# Patient Record
Sex: Female | Born: 1975 | Race: Black or African American | Hispanic: No | Marital: Single | Smoking: Never smoker
Health system: Southern US, Community
[De-identification: ages and names within clinical notes are randomized; demographics above are authoritative.]

## PROBLEM LIST (undated history)

## (undated) DIAGNOSIS — G43909 Migraine, unspecified, not intractable, without status migrainosus: Secondary | ICD-10-CM

---

## 2016-07-06 ENCOUNTER — Encounter (HOSPITAL_BASED_OUTPATIENT_CLINIC_OR_DEPARTMENT_OTHER): Payer: Self-pay | Admitting: *Deleted

## 2016-07-06 ENCOUNTER — Emergency Department (HOSPITAL_BASED_OUTPATIENT_CLINIC_OR_DEPARTMENT_OTHER)
Admission: EM | Admit: 2016-07-06 | Discharge: 2016-07-06 | Disposition: A | Discharge status: Home / Self Care | Payer: No Typology Code available for payment source | Attending: Physician Assistant | Admitting: Physician Assistant

## 2016-07-06 DIAGNOSIS — Y92410 Unspecified street and highway as the place of occurrence of the external cause: Secondary | ICD-10-CM | POA: Diagnosis not present

## 2016-07-06 DIAGNOSIS — M549 Dorsalgia, unspecified: Secondary | ICD-10-CM | POA: Insufficient documentation

## 2016-07-06 DIAGNOSIS — Y999 Unspecified external cause status: Secondary | ICD-10-CM | POA: Diagnosis not present

## 2016-07-06 DIAGNOSIS — Y939 Activity, unspecified: Secondary | ICD-10-CM | POA: Insufficient documentation

## 2016-07-06 MED ORDER — CYCLOBENZAPRINE HCL 10 MG PO TABS: 10.0000 mg | ORAL_TABLET | Freq: Two times a day (BID) | ORAL | 0 refills | Status: AC | PRN

## 2016-07-06 MED ORDER — IBUPROFEN 800 MG PO TABS: 800.0000 mg | ORAL_TABLET | Freq: Three times a day (TID) | ORAL | 0 refills | Status: DC
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
## 2017-10-26 ENCOUNTER — Other Ambulatory Visit: Payer: Self-pay

## 2017-10-26 ENCOUNTER — Encounter (HOSPITAL_COMMUNITY): Payer: Self-pay

## 2017-10-26 DIAGNOSIS — Z5321 Procedure and treatment not carried out due to patient leaving prior to being seen by health care provider: Secondary | ICD-10-CM | POA: Diagnosis not present

## 2017-10-26 DIAGNOSIS — R51 Headache: Secondary | ICD-10-CM | POA: Diagnosis not present

## 2017-10-26 NOTE — ED Triage Notes (Signed)
Patient states she has Margine with hx that started yesterday; patient states pain at 10/10 on arrival. Pt denies vision changes or disturbances; Patient states migraine feels worse that normal; pt a&ox 4 on arrival.-Monique,RN

## 2017-10-27 ENCOUNTER — Emergency Department (HOSPITAL_COMMUNITY)
Admission: EM | Admit: 2017-10-27 | Discharge: 2017-10-27 | Disposition: A | Discharge status: Home / Self Care | Payer: 59 | Attending: Emergency Medicine | Admitting: Emergency Medicine

## 2017-10-27 HISTORY — DX: Migraine, unspecified, not intractable, without status migrainosus: G43.909

## 2017-10-27 NOTE — ED Notes (Signed)
No answer for treatment room. LWBS after triage.

## 2017-10-27 NOTE — ED Notes (Signed)
Pt called twice no answer

## 2017-11-03 ENCOUNTER — Ambulatory Visit: Payer: Self-pay | Admitting: Neurology

## 2017-11-03 ENCOUNTER — Telehealth: Payer: Self-pay | Admitting: *Deleted

## 2017-11-03 NOTE — Telephone Encounter (Signed)
Patient no show new pt appointment on 11/03/2017 @ 10:00.

## 2017-11-07 ENCOUNTER — Encounter: Payer: Self-pay | Admitting: Neurology

## 2017-11-10 ENCOUNTER — Ambulatory Visit: Payer: Self-pay | Admitting: Neurology

## 2018-10-18 ENCOUNTER — Emergency Department (HOSPITAL_COMMUNITY): Payer: Medicaid Other

## 2018-10-18 ENCOUNTER — Encounter: Payer: Self-pay | Admitting: Emergency Medicine

## 2018-10-18 ENCOUNTER — Emergency Department (HOSPITAL_COMMUNITY)
Admission: EM | Admit: 2018-10-18 | Discharge: 2018-10-18 | Disposition: A | Discharge status: Home / Self Care | Payer: Medicaid Other | Attending: Emergency Medicine | Admitting: Emergency Medicine

## 2018-10-18 DIAGNOSIS — Z79899 Other long term (current) drug therapy: Secondary | ICD-10-CM | POA: Insufficient documentation

## 2018-10-18 DIAGNOSIS — R1084 Generalized abdominal pain: Secondary | ICD-10-CM | POA: Diagnosis present

## 2018-10-18 LAB — COMPREHENSIVE METABOLIC PANEL
ALK PHOS: 41 U/L (ref 38–126)
ALT: 18 U/L (ref 0–44)
ANION GAP: 7 (ref 5–15)
AST: 19 U/L (ref 15–41)
Albumin: 4.2 g/dL (ref 3.5–5.0)
BILIRUBIN TOTAL: 0.6 mg/dL (ref 0.3–1.2)
BUN: 16 mg/dL (ref 6–20)
CALCIUM: 9.4 mg/dL (ref 8.9–10.3)
CHLORIDE: 106 mmol/L (ref 98–111)
CO2: 25 mmol/L (ref 22–32)
Creatinine, Ser: 0.79 mg/dL (ref 0.44–1.00)
Glucose, Bld: 102 mg/dL — ABNORMAL HIGH (ref 70–99)
Potassium: 3.4 mmol/L — ABNORMAL LOW (ref 3.5–5.1)
Sodium: 138 mmol/L (ref 135–145)
Total Protein: 7.8 g/dL (ref 6.5–8.1)

## 2018-10-18 LAB — CBC WITH DIFFERENTIAL/PLATELET
Abs Immature Granulocytes: 0.04 10*3/uL (ref 0.00–0.07)
BASOS ABS: 0 10*3/uL (ref 0.0–0.1)
BASOS PCT: 0 %
Eosinophils Absolute: 0 10*3/uL (ref 0.0–0.5)
Eosinophils Relative: 0 %
HCT: 32.3 % — ABNORMAL LOW (ref 36.0–46.0)
HEMOGLOBIN: 8.8 g/dL — AB (ref 12.0–15.0)
Immature Granulocytes: 0 %
LYMPHS PCT: 26 %
Lymphs Abs: 2.3 10*3/uL (ref 0.7–4.0)
MCH: 18.4 pg — ABNORMAL LOW (ref 26.0–34.0)
MCHC: 27.2 g/dL — AB (ref 30.0–36.0)
MCV: 67.6 fL — ABNORMAL LOW (ref 80.0–100.0)
Monocytes Absolute: 1.2 10*3/uL — ABNORMAL HIGH (ref 0.1–1.0)
Monocytes Relative: 13 %
NEUTROS ABS: 5.4 10*3/uL (ref 1.7–7.7)
NRBC: 0 % (ref 0.0–0.2)
Neutrophils Relative %: 61 %
PLATELETS: 343 10*3/uL (ref 150–400)
RBC: 4.78 MIL/uL (ref 3.87–5.11)
RDW: 18.4 % — ABNORMAL HIGH (ref 11.5–15.5)
WBC: 9 10*3/uL (ref 4.0–10.5)

## 2018-10-18 LAB — WET PREP, GENITAL
Clue Cells Wet Prep HPF POC: NONE SEEN
Sperm: NONE SEEN
Trich, Wet Prep: NONE SEEN
YEAST WET PREP: NONE SEEN

## 2018-10-18 LAB — LIPASE, BLOOD: Lipase: 28 U/L (ref 11–51)

## 2018-10-18 LAB — POC URINE PREG, ED: PREG TEST UR: NEGATIVE

## 2018-10-18 MED ORDER — ONDANSETRON HCL 4 MG/2ML IJ SOLN
4.0000 mg | Freq: Once | INTRAMUSCULAR | Status: AC
  Administered 2018-10-18: 4 mg via INTRAVENOUS
  Filled 2018-10-18: qty 2

## 2018-10-18 MED ORDER — LACTATED RINGERS IV BOLUS
1000.0000 mL | Freq: Once | INTRAVENOUS | Status: AC
  Administered 2018-10-18: 1000 mL via INTRAVENOUS

## 2018-10-18 MED ORDER — IOHEXOL 300 MG/ML  SOLN
100.0000 mL | Freq: Once | INTRAMUSCULAR | Status: AC | PRN
  Administered 2018-10-18: 100 mL via INTRAVENOUS

## 2018-10-18 MED ORDER — ONDANSETRON 4 MG PO TBDP: ORAL_TABLET | ORAL | 0 refills | Status: AC

## 2018-10-18 MED ORDER — MORPHINE SULFATE (PF) 4 MG/ML IV SOLN
4.0000 mg | Freq: Once | INTRAVENOUS | Status: AC
  Administered 2018-10-18: 4 mg via INTRAVENOUS
  Filled 2018-10-18: qty 1

## 2018-10-18 NOTE — ED Notes (Signed)
Patient transported to Ultrasound 

## 2018-10-18 NOTE — Consult Note (Signed)
CC: Consult by Etta Quill NP for RLQ pain  HPI: Terri Shepard is an 42 y.o. female with no known past hx presented to ED today with 3d hx of nausea/vomiting/diarrhea and bilateral lower quadrant abdominal discomfort that she describes as crampy, non-radiating and intermittent. It is made better by moving around and changing positions. She denies any fever or chills. She denies ever having had this before. She denies sick contacts.  Past Medical History:  Diagnosis Date  . Migraine     History reviewed. No pertinent surgical history.  History reviewed. No pertinent family history.  Social:  reports that she has never smoked. She has never used smokeless tobacco. She reports that she does not drink alcohol or use drugs.  Allergies: No Known Allergies  Medications: I have reviewed the patient's current medications.  Results for orders placed or performed during the hospital encounter of 10/18/18 (from the past 48 hour(s))  Comprehensive metabolic panel     Status: Abnormal   Collection Time: 10/18/18  2:40 PM  Result Value Ref Range   Sodium 138 135 - 145 mmol/L   Potassium 3.4 (L) 3.5 - 5.1 mmol/L   Chloride 106 98 - 111 mmol/L   CO2 25 22 - 32 mmol/L   Glucose, Bld 102 (H) 70 - 99 mg/dL   BUN 16 6 - 20 mg/dL   Creatinine, Ser 0.79 0.44 - 1.00 mg/dL   Calcium 9.4 8.9 - 10.3 mg/dL   Total Protein 7.8 6.5 - 8.1 g/dL   Albumin 4.2 3.5 - 5.0 g/dL   AST 19 15 - 41 U/L   ALT 18 0 - 44 U/L   Alkaline Phosphatase 41 38 - 126 U/L   Total Bilirubin 0.6 0.3 - 1.2 mg/dL   GFR calc non Af Amer >60 >60 mL/min   GFR calc Af Amer >60 >60 mL/min    Comment: (NOTE) The eGFR has been calculated using the CKD EPI equation. This calculation has not been validated in all clinical situations. eGFR's persistently <60 mL/min signify possible Chronic Kidney Disease.    Anion gap 7 5 - 15    Comment: Performed at [facility] [address] [place], [place] [address]  Lipase, blood      Status: None   Collection Time: 10/18/18  2:40 PM  Result Value Ref Range   Lipase 28 11 - 51 U/L    Comment: Performed at [facility], [place] [address], [place], [place] [address]  CBC with Differential     Status: Abnormal   Collection Time: 10/18/18  2:40 PM  Result Value Ref Range   WBC 9.0 4.0 - 10.5 K/uL   RBC 4.78 3.87 - 5.11 MIL/uL   Hemoglobin 8.8 (L) 12.0 - 15.0 g/dL    Comment: Reticulocyte Hemoglobin testing may be clinically indicated, consider ordering this additional test [id number]    HCT 32.3 (L) 36.0 - 46.0 %   MCV 67.6 (L) 80.0 - 100.0 fL   MCH 18.4 (L) 26.0 - 34.0 pg   MCHC 27.2 (L) 30.0 - 36.0 g/dL   RDW 18.4 (H) 11.5 - 15.5 %   Platelets 343 150 - 400 K/uL   nRBC 0.0 0.0 - 0.2 %   Neutrophils Relative % 61 %   Neutro Abs 5.4 1.7 - 7.7 K/uL   Lymphocytes Relative 26 %   Lymphs Abs 2.3 0.7 - 4.0 K/uL   Monocytes Relative 13 %   Monocytes Absolute 1.2 (H) 0.1 - 1.0  K/uL   Eosinophils Relative 0 %   Eosinophils Absolute 0.0 0.0 - 0.5 K/uL   Basophils Relative 0 %   Basophils Absolute 0.0 0.0 - 0.1 K/uL   Immature Granulocytes 0 %   Abs Immature Granulocytes 0.04 0.00 - 0.07 K/uL    Comment: Performed at [facility] [address] [place], [place] [address]  POC urine preg, ED     Status: None   Collection Time: 10/18/18  2:55 PM  Result Value Ref Range   Preg Test, Ur NEGATIVE NEGATIVE    Comment:        THE SENSITIVITY OF THIS METHODOLOGY IS >24 mIU/mL   Wet prep, genital     Status: Abnormal   Collection Time: 10/18/18  6:06 PM  Result Value Ref Range   Yeast Wet Prep HPF POC NONE SEEN NONE SEEN   Trich, Wet Prep NONE SEEN NONE SEEN   Clue Cells Wet Prep HPF POC NONE SEEN NONE SEEN   WBC, Wet Prep HPF POC FEW (A) NONE SEEN   Sperm NONE SEEN     Comment: Performed at [facility], [place] [address], [place], [place] [address]    US Transvaginal Non-ob  Result Date: 10/18/2018 CLINICAL DATA:  Lower abdominal pain for 3 days.  EXAM: TRANSABDOMINAL AND TRANSVAGINAL ULTRASOUND OF PELVIS DOPPLER ULTRASOUND OF OVARIES TECHNIQUE: Both transabdominal and transvaginal ultrasound examinations of the pelvis were performed. Transabdominal technique was performed for global imaging of the pelvis including uterus, ovaries, adnexal regions, and pelvic cul-de-sac. It was necessary to proceed with endovaginal exam following the transabdominal exam to visualize the endometrium and ovaries. Color and duplex Doppler ultrasound was utilized to evaluate blood flow to the ovaries. COMPARISON:  None. FINDINGS: Uterus Measurements: 10.2 x 5.2 x 7.9 cm = volume: 216.8 mL. Multiple fibroids. A fibroid in the left fundus measures 1.8 x 1.4 x 1.4 cm. A fibroid in the right fundus measures 1.4 x 1.1 x 1.4 cm. A fibroid in the right uterine body measures 2.1 x 1.1 x 2.1 cm. Endometrium Thickness: 2 mm.  No focal abnormality visualized. Right ovary Not visualized. Left ovary Measurements: 3.5 x 2.4 x 2.2 cm = volume: 9.6 mL. Contains a follicle measuring up to 1.9 cm. Pulsed Doppler evaluation of the left ovary demonstrates normal low-resistance arterial and venous waveforms. Other findings No abnormal free fluid. IMPRESSION: 1. The right ovary was not visualized. 2. The left ovary is normal. 3. Fibroid uterus as above. Electronically Signed   By: Dorise Bullion III M.D   On: 10/18/2018 19:25   US Pelvis Complete  Result Date: 10/18/2018 CLINICAL DATA:  Lower abdominal pain for 3 days. EXAM: TRANSABDOMINAL AND TRANSVAGINAL ULTRASOUND OF PELVIS DOPPLER ULTRASOUND OF OVARIES TECHNIQUE: Both transabdominal and transvaginal ultrasound examinations of the pelvis were performed. Transabdominal technique was performed for global imaging of the pelvis including uterus, ovaries, adnexal regions, and pelvic cul-de-sac. It was necessary to proceed with endovaginal exam following the transabdominal exam to visualize the endometrium and ovaries. Color and duplex Doppler  ultrasound was utilized to evaluate blood flow to the ovaries. COMPARISON:  None. FINDINGS: Uterus Measurements: 10.2 x 5.2 x 7.9 cm = volume: 216.8 mL. Multiple fibroids. A fibroid in the left fundus measures 1.8 x 1.4 x 1.4 cm. A fibroid in the right fundus measures 1.4 x 1.1 x 1.4 cm. A fibroid in the right uterine body measures 2.1 x 1.1 x 2.1 cm. Endometrium Thickness: 2 mm.  No focal  abnormality visualized. Right ovary Not visualized. Left ovary Measurements: 3.5 x 2.4 x 2.2 cm = volume: 9.6 mL. Contains a follicle measuring up to 1.9 cm. Pulsed Doppler evaluation of the left ovary demonstrates normal low-resistance arterial and venous waveforms. Other findings No abnormal free fluid. IMPRESSION: 1. The right ovary was not visualized. 2. The left ovary is normal. 3. Fibroid uterus as above. Electronically Signed   By: Dorise Bullion III M.D   On: 10/18/2018 19:25   Ct Abdomen Pelvis W Contrast  Result Date: 10/18/2018 CLINICAL DATA:  Central abdominal pain with nausea, vomiting and diarrhea for 3 days, suspected diverticulitis EXAM: CT ABDOMEN AND PELVIS WITH CONTRAST TECHNIQUE: Multidetector CT imaging of the abdomen and pelvis was performed using the standard protocol following bolus administration of intravenous contrast. Sagittal and coronal MPR images reconstructed from axial data set. CONTRAST:  126m OMNIPAQUE IOHEXOL 300 MG/ML  SOLN IV COMPARISON:  None FINDINGS: Lower chest: Lung bases clear Hepatobiliary: Gallbladder and liver normal appearance Pancreas: Normal appearance Spleen: Normal appearance Adrenals/Urinary Tract: Adrenal glands and kidneys normal appearance. Ureters poorly visualized due to lack of retroperitoneal tissue planes. Bladder decompressed. Stomach/Bowel: Appendix not localized. Stomach unremarkable. Rectosigmoid colon under distended limiting assessment, unable to exclude wall thickening. Bowel loops otherwise grossly unremarkable for exam lacking GI contrast. No colonic  diverticula or focal inflammatory bowel process identified. Vascular/Lymphatic: Vascular structures appear grossly patent. Retroaortic LEFT renal vein. Aorta normal caliber. No adenopathy. Reproductive: Uterine nodularity suggesting leiomyomata. Unremarkable adnexa. Other: No free air or free fluid. No hernia or definite acute inflammatory process. Musculoskeletal: Normal appearance. IMPRESSION: No definite acute intra-abdominal or intrapelvic abnormalities as above. 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 Electronically Signed   By: MLavonia DanaM.D.   On: 10/18/2018 16:42   UKoreaArt/ven Flow Abd Pelv Doppler  Result Date: 10/18/2018 CLINICAL DATA:  Lower abdominal pain for 3 days. EXAM: TRANSABDOMINAL AND TRANSVAGINAL ULTRASOUND OF PELVIS DOPPLER ULTRASOUND OF OVARIES TECHNIQUE: Both transabdominal and transvaginal ultrasound examinations of the pelvis were performed. Transabdominal technique was performed for global imaging of the pelvis including uterus, ovaries, adnexal regions, and pelvic cul-de-sac. It was necessary to proceed with endovaginal exam following the transabdominal exam to visualize the endometrium and ovaries. Color and duplex Doppler ultrasound was utilized to evaluate blood flow to the ovaries. COMPARISON:  None. FINDINGS: Uterus Measurements: 10.2 x 5.2 x 7.9 cm = volume: 216.8 mL. Multiple fibroids. A fibroid in the left fundus measures 1.8 x 1.4 x 1.4 cm. A fibroid in the right fundus measures 1.4 x 1.1 x 1.4 cm. A fibroid in the right uterine body measures 2.1 x 1.1 x 2.1 cm. Endometrium Thickness: 2 mm.  No focal abnormality visualized. Right ovary Not visualized. Left ovary Measurements: 3.5 x 2.4 x 2.2 cm = volume: 9.6 mL. Contains a follicle measuring up to 1.9 cm. Pulsed Doppler evaluation of the left ovary demonstrates normal low-resistance arterial and venous waveforms. Other findings No abnormal free fluid. IMPRESSION: 1. The right ovary was not visualized. 2. The left ovary is  normal. 3. Fibroid uterus as above. Electronically Signed   By: DDorise BullionIII M.D   On: 10/18/2018 19:25    ROS - all of the below systems have been reviewed with the patient and positives are indicated with bold text General: chills, fever or night sweats Eyes: blurry vision or double vision ENT: epistaxis or sore throat Allergy/Immunology: itchy/watery eyes or nasal congestion Hematologic/Lymphatic: bleeding problems, blood clots or swollen lymph nodes Endocrine: temperature intolerance  or unexpected weight changes Breast: new or changing breast lumps or nipple discharge Resp: cough, shortness of breath, or wheezing CV: chest pain or dyspnea on exertion GI: as per HPI GU: dysuria, trouble voiding, or hematuria MSK: joint pain or joint stiffness Neuro: TIA or stroke symptoms Derm: pruritus and skin lesion changes Psych: anxiety and depression  PE Blood pressure 109/80, pulse 74, temperature 98.8 F (37.1 C), temperature source Oral, resp. rate 17, height 4' 10"  (1.473 m), weight 49.4 kg, last menstrual period 10/02/2018, SpO2 100 %. Constitutional: NAD; conversant; no deformities Eyes: Moist conjunctiva; no lid lag; anicteric; PERRL Neck: Trachea midline; no thyromegaly Lungs: Normal respiratory effort; no tactile fremitus CV: RRR; no palpable thrills; no pitting edema GI: Abd soft, not significantly tender on exam; she points to RLQ when asked where it hurts; non distended; no rebound; no guarding no palpable hepatosplenomegaly MSK: No clubbing; no cyanosis. Psychiatric: Appropriate affect; alert and oriented x3 Lymphatic: No palpable cervical or axillary lymphadenopathy  Results for orders placed or performed during the hospital encounter of 10/18/18 (from the past 48 hour(s))  Comprehensive metabolic panel     Status: Abnormal   Collection Time: 10/18/18  2:40 PM  Result Value Ref Range   Sodium 138 135 - 145 mmol/L   Potassium 3.4 (L) 3.5 - 5.1 mmol/L   Chloride 106  98 - 111 mmol/L   CO2 25 22 - 32 mmol/L   Glucose, Bld 102 (H) 70 - 99 mg/dL   BUN 16 6 - 20 mg/dL   Creatinine, Ser 0.79 0.44 - 1.00 mg/dL   Calcium 9.4 8.9 - 10.3 mg/dL   Total Protein 7.8 6.5 - 8.1 g/dL   Albumin 4.2 3.5 - 5.0 g/dL   AST 19 15 - 41 U/L   ALT 18 0 - 44 U/L   Alkaline Phosphatase 41 38 - 126 U/L   Total Bilirubin 0.6 0.3 - 1.2 mg/dL   GFR calc non Af Amer >60 >60 mL/min   GFR calc Af Amer >60 >60 mL/min    Comment: (NOTE) The eGFR has been calculated using the CKD EPI equation. This calculation has not been validated in all clinical situations. eGFR's persistently <60 mL/min signify possible Chronic Kidney Disease.    Anion gap 7 5 - 15    Comment: Performed at [facility] [address], [place], [place] [address]  Lipase, blood     Status: None   Collection Time: 10/18/18  2:40 PM  Result Value Ref Range   Lipase 28 11 - 51 U/L    Comment: Performed at [facility], [place] [address] [place], [place] [address]  CBC with Differential     Status: Abnormal   Collection Time: 10/18/18  2:40 PM  Result Value Ref Range   WBC 9.0 4.0 - 10.5 K/uL   RBC 4.78 3.87 - 5.11 MIL/uL   Hemoglobin 8.8 (L) 12.0 - 15.0 g/dL    Comment: Reticulocyte Hemoglobin testing may be clinically indicated, consider ordering this additional test [id number]    HCT 32.3 (L) 36.0 - 46.0 %   MCV 67.6 (L) 80.0 - 100.0 fL   MCH 18.4 (L) 26.0 - 34.0 pg   MCHC 27.2 (L) 30.0 - 36.0 g/dL   RDW 18.4 (H) 11.5 - 15.5 %   Platelets 343 150 - 400 K/uL   nRBC 0.0 0.0 - 0.2 %   Neutrophils Relative % 61 %   Neutro Abs 5.4 1.7 - 7.7 K/uL  Lymphocytes Relative 26 %   Lymphs Abs 2.3 0.7 - 4.0 K/uL   Monocytes Relative 13 %   Monocytes Absolute 1.2 (H) 0.1 - 1.0 K/uL   Eosinophils Relative 0 %   Eosinophils Absolute 0.0 0.0 - 0.5 K/uL   Basophils Relative 0 %   Basophils Absolute 0.0 0.0 - 0.1 K/uL   Immature Granulocytes 0 %   Abs Immature Granulocytes 0.04 0.00 - 0.07 K/uL     Comment: Performed at [facility] [address] [place], [place] [address]  POC urine preg, ED     Status: None   Collection Time: 10/18/18  2:55 PM  Result Value Ref Range   Preg Test, Ur NEGATIVE NEGATIVE    Comment:        THE SENSITIVITY OF THIS METHODOLOGY IS >24 mIU/mL   Wet prep, genital     Status: Abnormal   Collection Time: 10/18/18  6:06 PM  Result Value Ref Range   Yeast Wet Prep HPF POC NONE SEEN NONE SEEN   Trich, Wet Prep NONE SEEN NONE SEEN   Clue Cells Wet Prep HPF POC NONE SEEN NONE SEEN   WBC, Wet Prep HPF POC FEW (A) NONE SEEN   Sperm NONE SEEN     Comment: Performed at [facility], [place] [address], [place], [place] [address]    US Transvaginal Non-ob  Result Date: 10/18/2018 CLINICAL DATA:  Lower abdominal pain for 3 days. EXAM: TRANSABDOMINAL AND TRANSVAGINAL ULTRASOUND OF PELVIS DOPPLER ULTRASOUND OF OVARIES TECHNIQUE: Both transabdominal and transvaginal ultrasound examinations of the pelvis were performed. Transabdominal technique was performed for global imaging of the pelvis including uterus, ovaries, adnexal regions, and pelvic cul-de-sac. It was necessary to proceed with endovaginal exam following the transabdominal exam to visualize the endometrium and ovaries. Color and duplex Doppler ultrasound was utilized to evaluate blood flow to the ovaries. COMPARISON:  None. FINDINGS: Uterus Measurements: 10.2 x 5.2 x 7.9 cm = volume: 216.8 mL. Multiple fibroids. A fibroid in the left fundus measures 1.8 x 1.4 x 1.4 cm. A fibroid in the right fundus measures 1.4 x 1.1 x 1.4 cm. A fibroid in the right uterine body measures 2.1 x 1.1 x 2.1 cm. Endometrium Thickness: 2 mm.  No focal abnormality visualized. Right ovary Not visualized. Left ovary Measurements: 3.5 x 2.4 x 2.2 cm = volume: 9.6 mL. Contains a follicle measuring up to 1.9 cm. Pulsed Doppler evaluation of the left ovary demonstrates normal low-resistance arterial and venous waveforms. Other findings  No abnormal free fluid. IMPRESSION: 1. The right ovary was not visualized. 2. The left ovary is normal. 3. Fibroid uterus as above. Electronically Signed   By: Dorise Bullion III M.D   On: 10/18/2018 19:25   US Pelvis Complete  Result Date: 10/18/2018 CLINICAL DATA:  Lower abdominal pain for 3 days. EXAM: TRANSABDOMINAL AND TRANSVAGINAL ULTRASOUND OF PELVIS DOPPLER ULTRASOUND OF OVARIES TECHNIQUE: Both transabdominal and transvaginal ultrasound examinations of the pelvis were performed. Transabdominal technique was performed for global imaging of the pelvis including uterus, ovaries, adnexal regions, and pelvic cul-de-sac. It was necessary to proceed with endovaginal exam following the transabdominal exam to visualize the endometrium and ovaries. Color and duplex Doppler ultrasound was utilized to evaluate blood flow to the ovaries. COMPARISON:  None. FINDINGS: Uterus Measurements: 10.2 x 5.2 x 7.9 cm = volume: 216.8 mL. Multiple fibroids. A fibroid in the left fundus measures 1.8 x 1.4 x 1.4 cm. A fibroid in the right fundus  measures 1.4 x 1.1 x 1.4 cm. A fibroid in the right uterine body measures 2.1 x 1.1 x 2.1 cm. Endometrium Thickness: 2 mm.  No focal abnormality visualized. Right ovary Not visualized. Left ovary Measurements: 3.5 x 2.4 x 2.2 cm = volume: 9.6 mL. Contains a follicle measuring up to 1.9 cm. Pulsed Doppler evaluation of the left ovary demonstrates normal low-resistance arterial and venous waveforms. Other findings No abnormal free fluid. IMPRESSION: 1. The right ovary was not visualized. 2. The left ovary is normal. 3. Fibroid uterus as above. Electronically Signed   By: Dorise Bullion III M.D   On: 10/18/2018 19:25   Ct Abdomen Pelvis W Contrast  Result Date: 10/18/2018 CLINICAL DATA:  Central abdominal pain with nausea, vomiting and diarrhea for 3 days, suspected diverticulitis EXAM: CT ABDOMEN AND PELVIS WITH CONTRAST TECHNIQUE: Multidetector CT imaging of the abdomen and pelvis  was performed using the standard protocol following bolus administration of intravenous contrast. Sagittal and coronal MPR images reconstructed from axial data set. CONTRAST:  197m OMNIPAQUE IOHEXOL 300 MG/ML  SOLN IV COMPARISON:  None FINDINGS: Lower chest: Lung bases clear Hepatobiliary: Gallbladder and liver normal appearance Pancreas: Normal appearance Spleen: Normal appearance Adrenals/Urinary Tract: Adrenal glands and kidneys normal appearance. Ureters poorly visualized due to lack of retroperitoneal tissue planes. Bladder decompressed. Stomach/Bowel: Appendix not localized. Stomach unremarkable. Rectosigmoid colon under distended limiting assessment, unable to exclude wall thickening. Bowel loops otherwise grossly unremarkable for exam lacking GI contrast. No colonic diverticula or focal inflammatory bowel process identified. Vascular/Lymphatic: Vascular structures appear grossly patent. Retroaortic LEFT renal vein. Aorta normal caliber. No adenopathy. Reproductive: Uterine nodularity suggesting leiomyomata. Unremarkable adnexa. Other: No free air or free fluid. No hernia or definite acute inflammatory process. Musculoskeletal: Normal appearance. IMPRESSION: No definite acute intra-abdominal or intrapelvic abnormalities as above. 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 Electronically Signed   By: MLavonia DanaM.D.   On: 10/18/2018 16:42   UK[place]Art/ven Flow Abd Pelv Doppler  Result Date: 10/18/2018 CLINICAL DATA:  Lower abdominal pain for 3 days. EXAM: TRANSABDOMINAL AND TRANSVAGINAL ULTRASOUND OF PELVIS DOPPLER ULTRASOUND OF OVARIES TECHNIQUE: Both transabdominal and transvaginal ultrasound examinations of the pelvis were performed. Transabdominal technique was performed for global imaging of the pelvis including uterus, ovaries, adnexal regions, and pelvic cul-de-sac. It was necessary to proceed with endovaginal exam following the transabdominal exam to visualize the endometrium and ovaries. Color and  duplex Doppler ultrasound was utilized to evaluate blood flow to the ovaries. COMPARISON:  None. FINDINGS: Uterus Measurements: 10.2 x 5.2 x 7.9 cm = volume: 216.8 mL. Multiple fibroids. A fibroid in the left fundus measures 1.8 x 1.4 x 1.4 cm. A fibroid in the right fundus measures 1.4 x 1.1 x 1.4 cm. A fibroid in the right uterine body measures 2.1 x 1.1 x 2.1 cm. Endometrium Thickness: 2 mm.  No focal abnormality visualized. Right ovary Not visualized. Left ovary Measurements: 3.5 x 2.4 x 2.2 cm = volume: 9.6 mL. Contains a follicle measuring up to 1.9 cm. Pulsed Doppler evaluation of the left ovary demonstrates normal low-resistance arterial and venous waveforms. Other findings No abnormal free fluid. IMPRESSION: 1. The right ovary was not visualized. 2. The left ovary is normal. 3. Fibroid uterus as above. Electronically Signed   By: DDorise BullionIII M.D   On: 10/18/2018 19:25   A/P: LMikahla Wisoris an 42y.o. female with 3d hx of n/v/d and crampy bilateral lower quadrant abdominal pain  -She has been afebrile, WBC is normal.  She had no significant tenderness on exam including in her RLQ when I saw her tonight. Workup including CT abd/pelvis with IV contrast has not shown etiology per se. The appendix was not visualized which in the setting of 3 days worth of symptoms should be apparent if this was the culprit. There was also no noted inflammation in the surrounding tissues. -Etiology for her symptoms could be gastroenteritis, GU, colitis, among others. Will defer to ED/medicine for further evaluation and treatment as necessary  Sharon Mt. Dema Severin, M.D. [facility]

## 2018-10-18 NOTE — ED Provider Notes (Signed)
[facility] EMERGENCY DEPARTMENT Provider Note   CSN: [id number] Arrival date & time: 10/18/18  1429     History   Chief Complaint Chief Complaint  Patient presents with  . Abdominal Pain    HPI Terri Shepard is a 42 y.o. female.  HPI  42 year old female presents with abdominal pain.  Started 2 days ago.  All of this first started with vomiting and diarrhea and then the abdominal pain started.  She is still continued to have vomiting and is unable to swallow fluids.  The diarrhea has stopped.  There is no blood in either.  She has not had a fever but has been having progressively worsening sharp abdominal pain.  It is mostly lower and bilateral.  No dysuria, vaginal bleeding, or discharge.  No back pain or chest pain.  Pain is 10 out of 10 and she has not taken any medicines for this.  No prior similar episodes.  Patient states that certain movements such as laying flat worsen the pain.  Past Medical History:  Diagnosis Date  . Migraine     There are no active problems to display for this patient.   History reviewed. No pertinent surgical history.   OB History   None      Home Medications    Prior to Admission medications   Medication Sig Start Date End Date Taking? Authorizing Provider  cyclobenzaprine (FLEXERIL) 10 MG tablet Take 1 tablet (10 mg total) by mouth 2 (two) times daily as needed for muscle spasms. 07/06/16   Mackuen, Courteney Lyn, MD  ibuprofen (ADVIL,MOTRIN) 800 MG tablet Take 1 tablet (800 mg total) by mouth 3 (three) times daily. 07/06/16   Mackuen, Cindee Salt, MD    Family History History reviewed. No pertinent family history.  Social History Social History   Tobacco Use  . Smoking status: Never Smoker  . Smokeless tobacco: Never Used  Substance Use Topics  . Alcohol use: No    Frequency: Never  . Drug use: No     Allergies   Patient has no known allergies.   Review of Systems Review of Systems  Constitutional:  Negative for fever.  Respiratory: Negative for shortness of breath.   Cardiovascular: Negative for chest pain.  Gastrointestinal: Positive for abdominal pain, diarrhea, nausea and vomiting. Negative for blood in stool.  Genitourinary: Negative for dysuria, vaginal bleeding and vaginal discharge.  Musculoskeletal: Negative for back pain.  All other systems reviewed and are negative.    Physical Exam Updated Vital Signs BP 125/87   Pulse (!) 103   Temp 98.8 F (37.1 C) (Oral)   Resp 18   Ht 4\' 10"  (1.473 m)   Wt 49.4 kg   LMP 10/02/2018 (Approximate)   SpO2 100%   BMI 22.78 kg/m   Physical Exam  Constitutional: She appears well-developed and well-nourished.  Non-toxic appearance. She does not appear ill.  HENT:  Head: Normocephalic and atraumatic.  Right Ear: External ear normal.  Left Ear: External ear normal.  Nose: Nose normal.  Eyes: Right eye exhibits no discharge. Left eye exhibits no discharge.  Cardiovascular: Regular rhythm and normal heart sounds. Tachycardia present.  Pulmonary/Chest: Effort normal and breath sounds normal.  Abdominal: Soft. There is tenderness in the right lower quadrant, suprapubic area and left lower quadrant.    Genitourinary: Uterus is tender. Cervix exhibits no motion tenderness and no discharge. Right adnexum displays no tenderness. Left adnexum displays no tenderness. No bleeding in the vagina. No vaginal discharge  found.  Neurological: She is alert.  Skin: Skin is warm and dry.  Psychiatric: Her mood appears not anxious.  Nursing note and vitals reviewed.    ED Treatments / Results  Labs (all labs ordered are listed, but only abnormal results are displayed) Labs Reviewed  WET PREP, GENITAL - Abnormal; Notable for the following components:      Result Value   WBC, Wet Prep HPF POC FEW (*)    All other components within normal limits  COMPREHENSIVE METABOLIC PANEL - Abnormal; Notable for the following components:   Potassium 3.4  (*)    Glucose, Bld 102 (*)    All other components within normal limits  CBC WITH DIFFERENTIAL/PLATELET - Abnormal; Notable for the following components:   Hemoglobin 8.8 (*)    HCT 32.3 (*)    MCV 67.6 (*)    MCH 18.4 (*)    MCHC 27.2 (*)    RDW 18.4 (*)    Monocytes Absolute 1.2 (*)    All other components within normal limits  LIPASE, BLOOD  URINALYSIS, ROUTINE W REFLEX MICROSCOPIC  POC URINE PREG, ED  GC/CHLAMYDIA PROBE AMP ([place]) [facility]    EKG None  Radiology Ct Abdomen Pelvis W Contrast  Result Date: 10/18/2018 CLINICAL DATA:  Central abdominal pain with nausea, vomiting and diarrhea for 3 days, suspected diverticulitis EXAM: CT ABDOMEN AND PELVIS WITH CONTRAST TECHNIQUE: Multidetector CT imaging of the abdomen and pelvis was performed using the standard protocol following bolus administration of intravenous contrast. Sagittal and coronal MPR images reconstructed from axial data set. CONTRAST:  100mL OMNIPAQUE IOHEXOL 300 MG/ML  SOLN IV COMPARISON:  None FINDINGS: Lower chest: Lung bases clear Hepatobiliary: Gallbladder and liver normal appearance Pancreas: Normal appearance Spleen: Normal appearance Adrenals/Urinary Tract: Adrenal glands and kidneys normal appearance. Ureters poorly visualized due to lack of retroperitoneal tissue planes. Bladder decompressed. Stomach/Bowel: Appendix not localized. Stomach unremarkable. Rectosigmoid colon under distended limiting assessment, unable to exclude wall thickening. Bowel loops otherwise grossly unremarkable for exam lacking GI contrast. No colonic diverticula or focal inflammatory bowel process identified. Vascular/Lymphatic: Vascular structures appear grossly patent. Retroaortic LEFT renal vein. Aorta normal caliber. No adenopathy. Reproductive: Uterine nodularity suggesting leiomyomata. Unremarkable adnexa. Other: No free air or free fluid. No hernia or definite acute inflammatory process. Musculoskeletal: Normal appearance.  IMPRESSION: No definite acute intra-abdominal or intrapelvic abnormalities as above. 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 Electronically Signed   By: Ulyses SouthwardMark  Boles M.D.   On: 10/18/2018 16:42    Procedures Procedures (including critical care time)  Medications Ordered in ED Medications  lactated ringers bolus 1,000 mL (0 mLs Intravenous Stopped 10/18/18 1805)  morphine 4 MG/ML injection 4 mg (4 mg Intravenous Given 10/18/18 1447)  iohexol (OMNIPAQUE) 300 MG/ML solution 100 mL (100 mLs Intravenous Contrast Given 10/18/18 1613)  ondansetron (ZOFRAN) injection 4 mg (4 mg Intravenous Given 10/18/18 1804)  morphine 4 MG/ML injection 4 mg (4 mg Intravenous Given 10/18/18 1805)     Initial Impression / Assessment and Plan / ED Course  I have reviewed the triage vital signs and the nursing notes.  Pertinent labs & imaging results that were available during my care of the patient were reviewed by me and considered in my medical decision making (see chart for details).     CT scan does not show any obvious acute pathology causing her abdominal pain.  She does have a fibroid uterus but states that she has had this before and had surgery before.  Pain  is not similar to before however.  Some of her pain is right lower quadrant and then sometimes a suprapubic.  CT cannot see her appendix but there is no inflammatory changes.  Hemoglobin of 8.8 is similar to prior when compared to last year.  She has chronic anemia.  Pelvic exam shows some tenderness to and so an ultrasound is currently pending.  If this shows an obvious cause such as torsion, treat as such.  If not, she will need reexamination and if still having significant pain may need surgical consult. Care to Felicie Morn, NP with ultrasound pending.  Final Clinical Impressions(s) / ED Diagnoses   Final diagnoses:  None    ED Discharge Orders    None       Pricilla Loveless, MD 10/18/18 [phone]

## 2018-10-18 NOTE — ED Notes (Signed)
Pelvic cart at bedside. 

## 2018-10-18 NOTE — ED Provider Notes (Signed)
Results for orders placed or performed during the hospital encounter of 10/18/18  Wet prep, genital  Result Value Ref Range   Yeast Wet Prep HPF POC NONE SEEN NONE SEEN   Trich, Wet Prep NONE SEEN NONE SEEN   Clue Cells Wet Prep HPF POC NONE SEEN NONE SEEN   WBC, Wet Prep HPF POC FEW (A) NONE SEEN   Sperm NONE SEEN   Comprehensive metabolic panel  Result Value Ref Range   Sodium 138 135 - 145 mmol/L   Potassium 3.4 (L) 3.5 - 5.1 mmol/L   Chloride 106 98 - 111 mmol/L   CO2 25 22 - 32 mmol/L   Glucose, Bld 102 (H) 70 - 99 mg/dL   BUN 16 6 - 20 mg/dL   Creatinine, Ser 1.61 0.44 - 1.00 mg/dL   Calcium 9.4 8.9 - 09.6 mg/dL   Total Protein 7.8 6.5 - 8.1 g/dL   Albumin 4.2 3.5 - 5.0 g/dL   AST 19 15 - 41 U/L   ALT 18 0 - 44 U/L   Alkaline Phosphatase 41 38 - 126 U/L   Total Bilirubin 0.6 0.3 - 1.2 mg/dL   GFR calc non Af Amer >60 >60 mL/min   GFR calc Af Amer >60 >60 mL/min   Anion gap 7 5 - 15  Lipase, blood  Result Value Ref Range   Lipase 28 11 - 51 U/L  CBC with Differential  Result Value Ref Range   WBC 9.0 4.0 - 10.5 K/uL   RBC 4.78 3.87 - 5.11 MIL/uL   Hemoglobin 8.8 (L) 12.0 - 15.0 g/dL   HCT 04.5 (L) 40.9 - 81.1 %   MCV 67.6 (L) 80.0 - 100.0 fL   MCH 18.4 (L) 26.0 - 34.0 pg   MCHC 27.2 (L) 30.0 - 36.0 g/dL   RDW 91.4 (H) 78.2 - 95.6 %   Platelets 343 150 - 400 K/uL   nRBC 0.0 0.0 - 0.2 %   Neutrophils Relative % 61 %   Neutro Abs 5.4 1.7 - 7.7 K/uL   Lymphocytes Relative 26 %   Lymphs Abs 2.3 0.7 - 4.0 K/uL   Monocytes Relative 13 %   Monocytes Absolute 1.2 (H) 0.1 - 1.0 K/uL   Eosinophils Relative 0 %   Eosinophils Absolute 0.0 0.0 - 0.5 K/uL   Basophils Relative 0 %   Basophils Absolute 0.0 0.0 - 0.1 K/uL   Immature Granulocytes 0 %   Abs Immature Granulocytes 0.04 0.00 - 0.07 K/uL  POC urine preg, ED  Result Value Ref Range   Preg Test, Ur NEGATIVE NEGATIVE   US Transvaginal Non-ob  Result Date: 10/18/2018 CLINICAL DATA:  Lower abdominal pain for 3  days. EXAM: TRANSABDOMINAL AND TRANSVAGINAL ULTRASOUND OF PELVIS DOPPLER ULTRASOUND OF OVARIES TECHNIQUE: Both transabdominal and transvaginal ultrasound examinations of the pelvis were performed. Transabdominal technique was performed for global imaging of the pelvis including uterus, ovaries, adnexal regions, and pelvic cul-de-sac. It was necessary to proceed with endovaginal exam following the transabdominal exam to visualize the endometrium and ovaries. Color and duplex Doppler ultrasound was utilized to evaluate blood flow to the ovaries. COMPARISON:  None. FINDINGS: Uterus Measurements: 10.2 x 5.2 x 7.9 cm = volume: 216.8 mL. Multiple fibroids. A fibroid in the left fundus measures 1.8 x 1.4 x 1.4 cm. A fibroid in the right fundus measures 1.4 x 1.1 x 1.4 cm. A fibroid in the right uterine body measures 2.1 x 1.1 x 2.1 cm. Endometrium Thickness: 2 mm.  No focal abnormality visualized. Right ovary Not visualized. Left ovary Measurements: 3.5 x 2.4 x 2.2 cm = volume: 9.6 mL. Contains a follicle measuring up to 1.9 cm. Pulsed Doppler evaluation of the left ovary demonstrates normal low-resistance arterial and venous waveforms. Other findings No abnormal free fluid. IMPRESSION: 1. The right ovary was not visualized. 2. The left ovary is normal. 3. Fibroid uterus as above. Electronically Signed   By: Gerome Samavid  Williams III M.D   On: 10/18/2018 19:25   [place] Pelvis Complete  Result Date: 10/18/2018 CLINICAL DATA:  Lower abdominal pain for 3 days. EXAM: TRANSABDOMINAL AND TRANSVAGINAL ULTRASOUND OF PELVIS DOPPLER ULTRASOUND OF OVARIES TECHNIQUE: Both transabdominal and transvaginal ultrasound examinations of the pelvis were performed. Transabdominal technique was performed for global imaging of the pelvis including uterus, ovaries, adnexal regions, and pelvic cul-de-sac. It was necessary to proceed with endovaginal exam following the transabdominal exam to visualize the endometrium and ovaries. Color and duplex  Doppler ultrasound was utilized to evaluate blood flow to the ovaries. COMPARISON:  None. FINDINGS: Uterus Measurements: 10.2 x 5.2 x 7.9 cm = volume: 216.8 mL. Multiple fibroids. A fibroid in the left fundus measures 1.8 x 1.4 x 1.4 cm. A fibroid in the right fundus measures 1.4 x 1.1 x 1.4 cm. A fibroid in the right uterine body measures 2.1 x 1.1 x 2.1 cm. Endometrium Thickness: 2 mm.  No focal abnormality visualized. Right ovary Not visualized. Left ovary Measurements: 3.5 x 2.4 x 2.2 cm = volume: 9.6 mL. Contains a follicle measuring up to 1.9 cm. Pulsed Doppler evaluation of the left ovary demonstrates normal low-resistance arterial and venous waveforms. Other findings No abnormal free fluid. IMPRESSION: 1. The right ovary was not visualized. 2. The left ovary is normal. 3. Fibroid uterus as above. Electronically Signed   By: Gerome Samavid  Williams III M.D   On: 10/18/2018 19:25   Ct Abdomen Pelvis W Contrast  Result Date: 10/18/2018 CLINICAL DATA:  Central abdominal pain with nausea, vomiting and diarrhea for 3 days, suspected diverticulitis EXAM: CT ABDOMEN AND PELVIS WITH CONTRAST TECHNIQUE: Multidetector CT imaging of the abdomen and pelvis was performed using the standard protocol following bolus administration of intravenous contrast. Sagittal and coronal MPR images reconstructed from axial data set. CONTRAST:  100mL OMNIPAQUE IOHEXOL 300 MG/ML  SOLN IV COMPARISON:  None FINDINGS: Lower chest: Lung bases clear Hepatobiliary: Gallbladder and liver normal appearance Pancreas: Normal appearance Spleen: Normal appearance Adrenals/Urinary Tract: Adrenal glands and kidneys normal appearance. Ureters poorly visualized due to lack of retroperitoneal tissue planes. Bladder decompressed. Stomach/Bowel: Appendix not localized. Stomach unremarkable. Rectosigmoid colon under distended limiting assessment, unable to exclude wall thickening. Bowel loops otherwise grossly unremarkable for exam lacking GI contrast. No  colonic diverticula or focal inflammatory bowel process identified. Vascular/Lymphatic: Vascular structures appear grossly patent. Retroaortic LEFT renal vein. Aorta normal caliber. No adenopathy. Reproductive: Uterine nodularity suggesting leiomyomata. Unremarkable adnexa. Other: No free air or free fluid. No hernia or definite acute inflammatory process. Musculoskeletal: Normal appearance. IMPRESSION: No definite acute intra-abdominal or intrapelvic abnormalities as above. 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 Electronically Signed   By: Ulyses SouthwardMark  Boles M.D.   On: 10/18/2018 16:42   [place] Art/ven Flow Abd Pelv Doppler  Result Date: 10/18/2018 CLINICAL DATA:  Lower abdominal pain for 3 days. EXAM: TRANSABDOMINAL AND TRANSVAGINAL ULTRASOUND OF PELVIS DOPPLER ULTRASOUND OF OVARIES TECHNIQUE: Both transabdominal and transvaginal ultrasound examinations of the pelvis were performed. Transabdominal technique was performed for global imaging of the pelvis including uterus, ovaries, adnexal regions,  and pelvic cul-de-sac. It was necessary to proceed with endovaginal exam following the transabdominal exam to visualize the endometrium and ovaries. Color and duplex Doppler ultrasound was utilized to evaluate blood flow to the ovaries. COMPARISON:  None. FINDINGS: Uterus Measurements: 10.2 x 5.2 x 7.9 cm = volume: 216.8 mL. Multiple fibroids. A fibroid in the left fundus measures 1.8 x 1.4 x 1.4 cm. A fibroid in the right fundus measures 1.4 x 1.1 x 1.4 cm. A fibroid in the right uterine body measures 2.1 x 1.1 x 2.1 cm. Endometrium Thickness: 2 mm.  No focal abnormality visualized. Right ovary Not visualized. Left ovary Measurements: 3.5 x 2.4 x 2.2 cm = volume: 9.6 mL. Contains a follicle measuring up to 1.9 cm. Pulsed Doppler evaluation of the left ovary demonstrates normal low-resistance arterial and venous waveforms. Other findings No abnormal free fluid. IMPRESSION: 1. The right ovary was not visualized. 2. The left  ovary is normal. 3. Fibroid uterus as above. Electronically Signed   By: Gerome Sam III M.D   On: 10/18/2018 19:25     On repeat exam, patient continues to exhibit RLQ tenderness. Surgical consult obtained.   Terri Shepard is an 42 y.o. female with 3d hx of n/v/d and crampy bilateral lower quadrant abdominal pain  -She has been afebrile, WBC is normal. She had no significant tenderness on exam including in her RLQ when I saw her tonight. Workup including CT abd/pelvis with IV contrast has not shown etiology per se. The appendix was not visualized which in the setting of 3 days worth of symptoms should be apparent if this was the culprit. There was also no noted inflammation in the surrounding tissues. -Etiology for her symptoms could be gastroenteritis, GU, colitis, among others. Will defer to ED/medicine for further evaluation and treatment as necessary  Stephanie Coup. Cliffton Asters, M.D. [facility]  Patient will be discharged home with zofran for nausea/vomiting. Care instructions provided and return precautions discussed.  Patient is nontoxic, nonseptic appearing. Patient's pain and other symptoms adequately managed in emergency department.  Fluid bolus given.  Labs, imaging and vitals reviewed.  Patient does not meet the SIRS or Sepsis criteria.  Patient evaluated by surgery and does not have a surgical abdomen and there are no peritoneal signs.  No indication of appendicitis, bowel obstruction, bowel perforation, cholecystitis, diverticulitis.  Patient discharged home with symptomatic treatment and given strict instructions for follow-up with their primary care physician.  I have also discussed reasons to return immediately to the ER.  Patient expresses understanding and agrees with plan.     Felicie Morn, NP 10/18/18 [id number]    Pricilla Loveless, MD 10/19/18 1120

## 2018-10-18 NOTE — ED Notes (Signed)
Pt stable, ambulatory, states understanding of discharge instructions 

## 2018-10-18 NOTE — ED Notes (Signed)
Patient transported to CT 

## 2018-10-18 NOTE — ED Triage Notes (Addendum)
Pt presents with 3 day h/o umbilical pain, nausea, vomiting and diarrhea.  Pt unsure of sick contact; reports subjective fevers.  Pt denies dysuria or vaginal discharge.

## 2018-10-18 NOTE — ED Notes (Signed)
Pt transported to radiology.

## 2018-10-19 LAB — GC/CHLAMYDIA PROBE AMP (~~LOC~~) NOT AT ARMC
Chlamydia: NEGATIVE
Neisseria Gonorrhea: NEGATIVE

## 2019-07-02 IMAGING — CT CT ABD-PELV W/ CM
2 of 6 series · 16 of 46 positions shown, 18 images · IV contrast (omnipaque)
Comparison: None

CLINICAL DATA: Central abdominal pain with nausea, vomiting and
diarrhea for 3 days, suspected diverticulitis

EXAM:
CT ABDOMEN AND PELVIS WITH CONTRAST
TECHNIQUE: Multidetector CT imaging of the abdomen and pelvis was performed
using the standard protocol following bolus administration of
intravenous contrast. Sagittal and coronal MPR images reconstructed
from axial data set.
CONTRAST:  100mL OMNIPAQUE IOHEXOL 300 MG/ML  SOLN IV

[Series 3: a/p w/ 5mm · axial · 0.58mm/px · z∈[+697,+1072]mm · 13 of 85 slices shown, 15 images]
[im 5/85  soft-tissue]
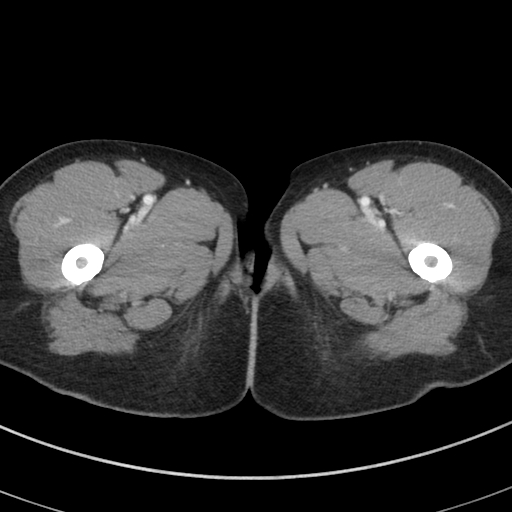
[im 5/85  bone]
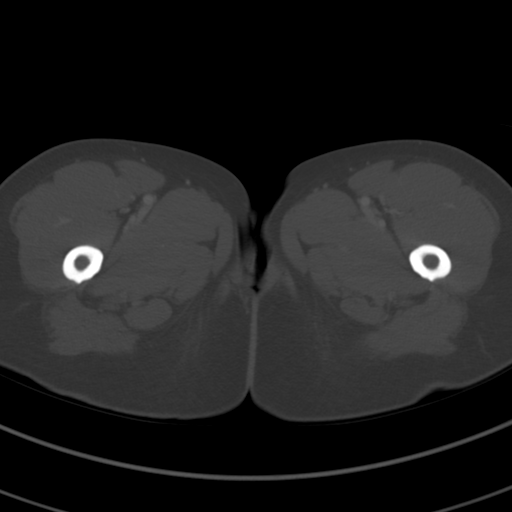
[im 14/85  soft-tissue]
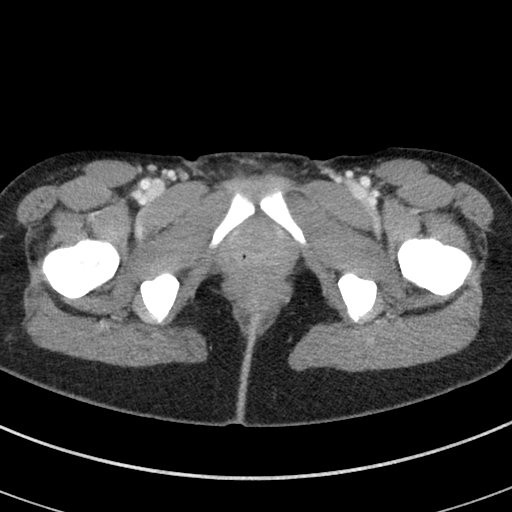
[im 18/85  soft-tissue]
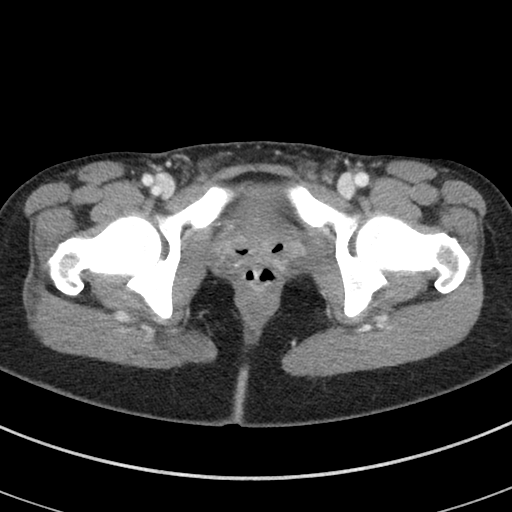
[im 23/85  soft-tissue]
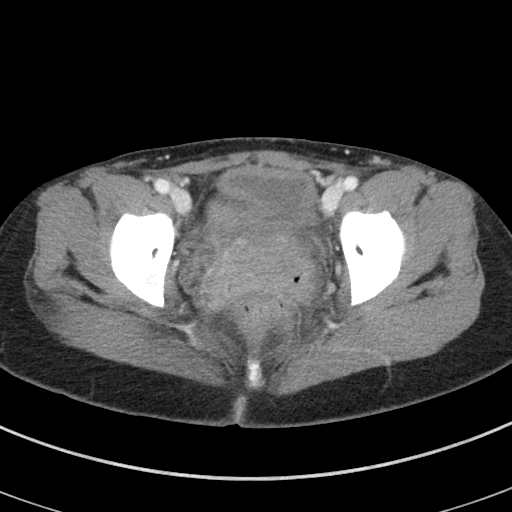
[im 31/85  soft-tissue]
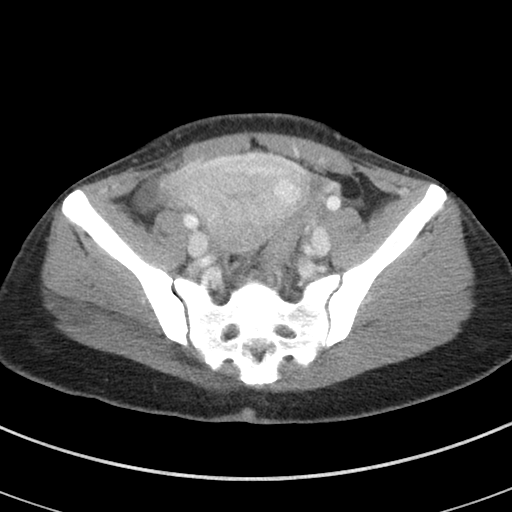
[im 36/85  soft-tissue]
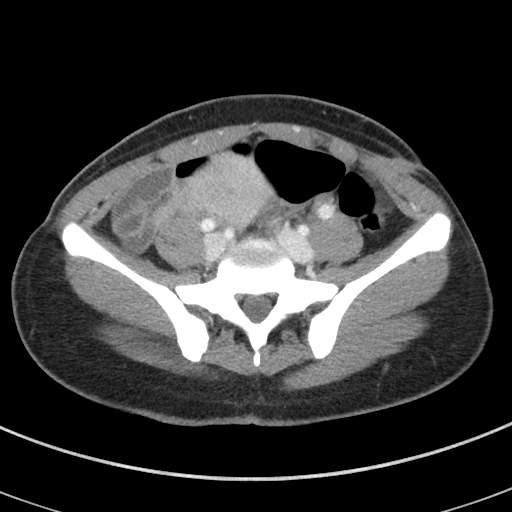
[im 45/85  soft-tissue]
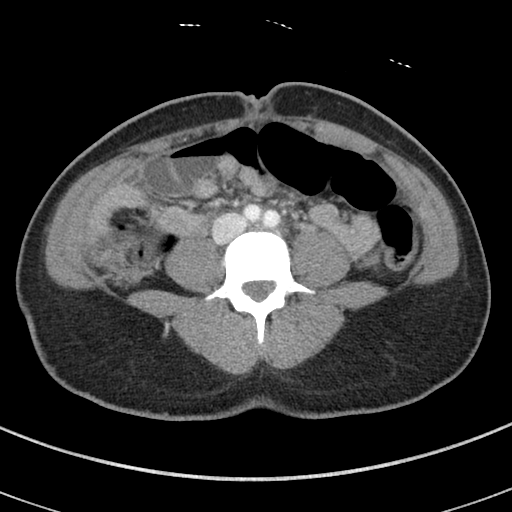
[im 49/85  soft-tissue]
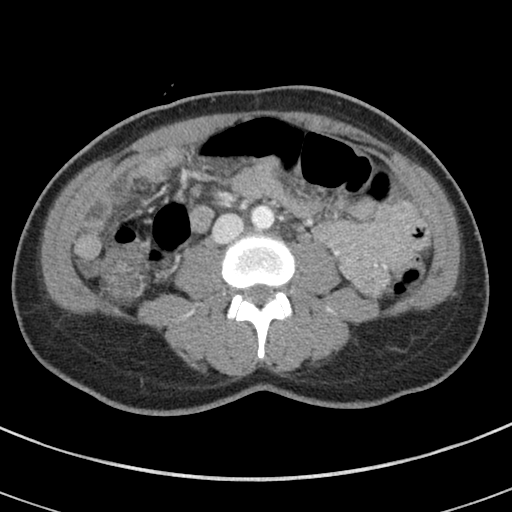
[im 54/85  soft-tissue]
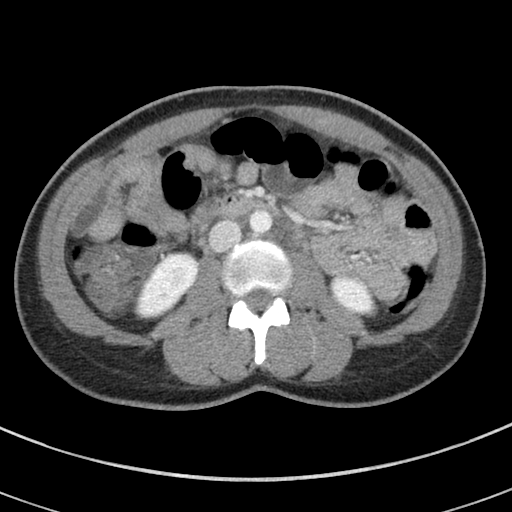
[im 54/85  bone]
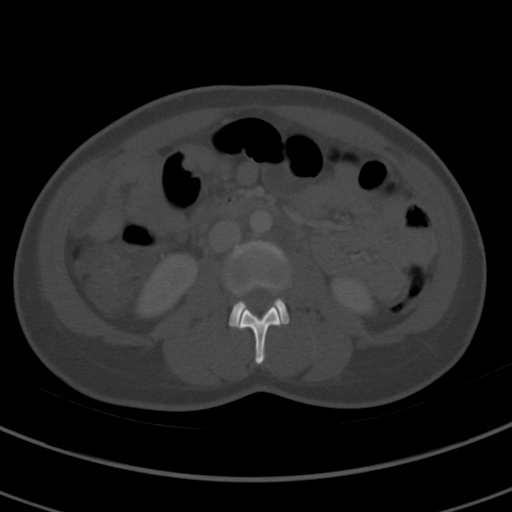
[im 62/85  soft-tissue]
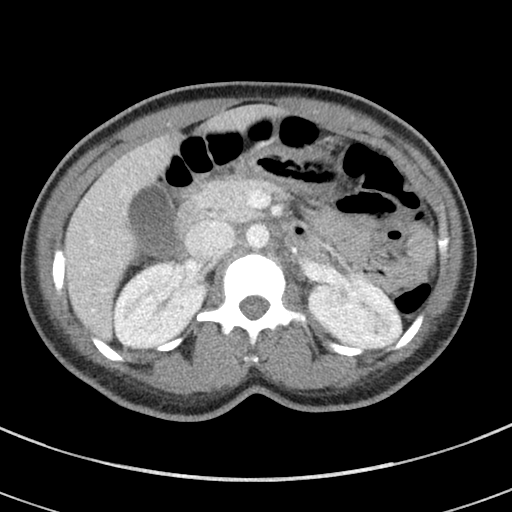
[im 67/85  soft-tissue]
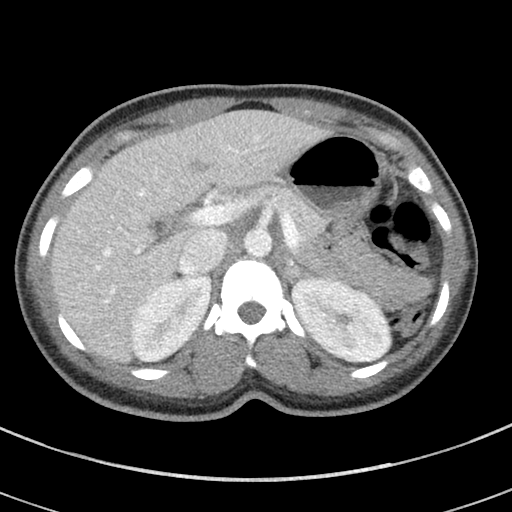
[im 71/85  soft-tissue]
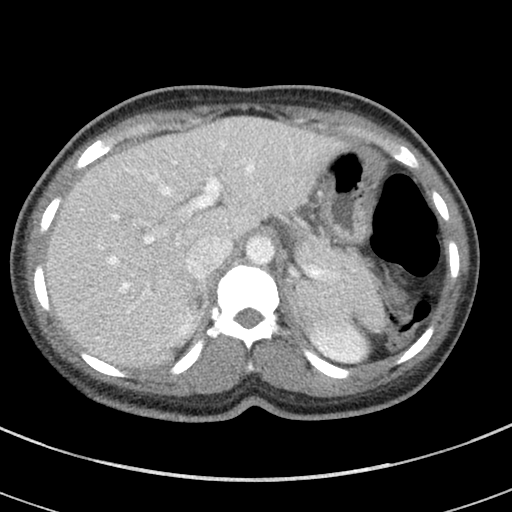
[im 80/85  soft-tissue]
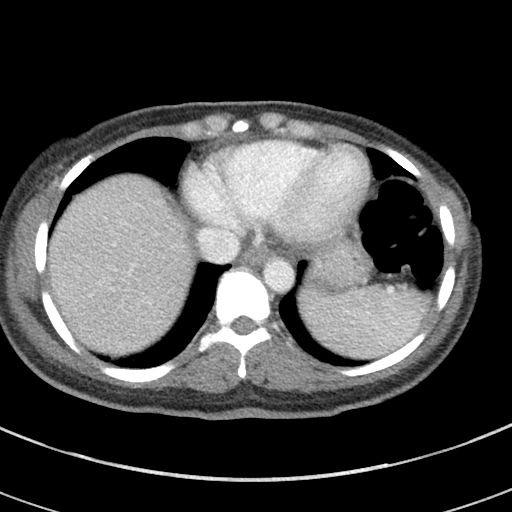

[Series 6: a/p w/ cor · coronal · 0.72mm/px · 3 of 103 slices shown]
[im 35/103  soft-tissue]
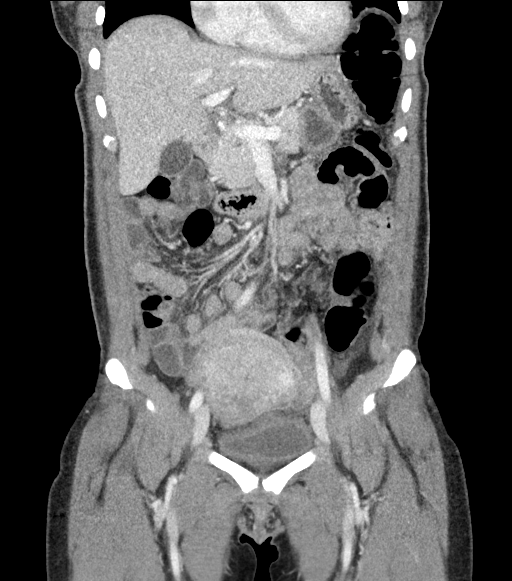
[im 46/103  soft-tissue]
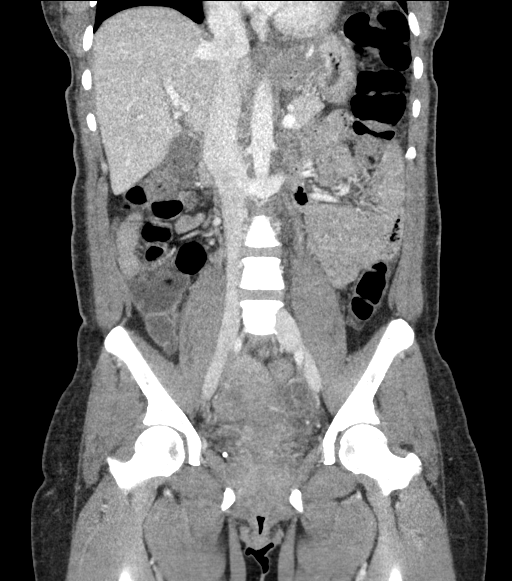
[im 57/103  soft-tissue]
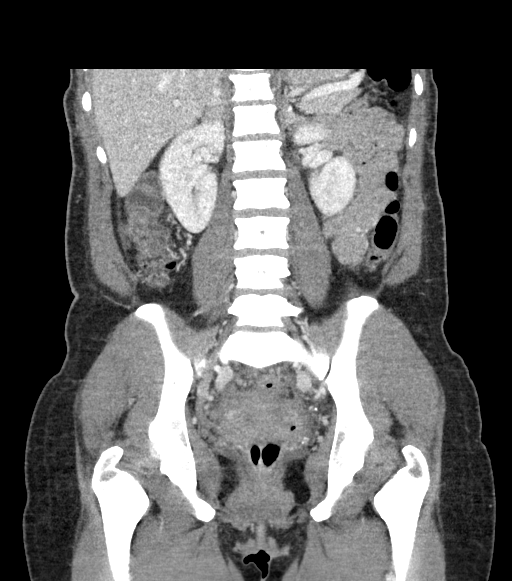

[16 of 46 positions shown; findings below may reference images not displayed]

FINDINGS: Lower chest: Lung bases clear

Hepatobiliary: Gallbladder and liver normal appearance

Pancreas: Normal appearance

Spleen: Normal appearance

Adrenals/Urinary Tract: Adrenal glands and kidneys normal
appearance. Ureters poorly visualized due to lack of retroperitoneal
tissue planes. Bladder decompressed.

Stomach/Bowel: Appendix not localized. Stomach unremarkable.
Rectosigmoid colon under distended limiting assessment, unable to
exclude wall thickening. Bowel loops otherwise grossly unremarkable
for exam lacking GI contrast. No colonic diverticula or focal
inflammatory bowel process identified.

Vascular/Lymphatic: Vascular structures appear grossly patent.
Retroaortic LEFT renal vein. Aorta normal caliber. No adenopathy.

Reproductive: Uterine nodularity suggesting leiomyomata.
Unremarkable adnexa.

Other: No free air or free fluid. No hernia or definite acute
inflammatory process.

Musculoskeletal: Normal appearance.
IMPRESSION: No definite acute intra-abdominal or intrapelvic abnormalities as
above.

Suspected multiple uterine leiomyomata.

## 2021-07-12 ENCOUNTER — Ambulatory Visit: Payer: BC Managed Care – PPO | Admitting: Nurse Practitioner

## 2024-12-12 ENCOUNTER — Emergency Department (HOSPITAL_COMMUNITY)

## 2024-12-12 ENCOUNTER — Emergency Department (HOSPITAL_COMMUNITY)
Admission: EM | Admit: 2024-12-12 | Discharge: 2024-12-12 | Disposition: A | Discharge status: Home / Self Care | Attending: Emergency Medicine | Admitting: Emergency Medicine

## 2024-12-12 DIAGNOSIS — Y92410 Unspecified street and highway as the place of occurrence of the external cause: Secondary | ICD-10-CM | POA: Insufficient documentation

## 2024-12-12 DIAGNOSIS — M542 Cervicalgia: Secondary | ICD-10-CM | POA: Insufficient documentation

## 2024-12-12 DIAGNOSIS — M546 Pain in thoracic spine: Secondary | ICD-10-CM | POA: Insufficient documentation

## 2024-12-12 DIAGNOSIS — S0990XA Unspecified injury of head, initial encounter: Secondary | ICD-10-CM | POA: Insufficient documentation

## 2024-12-12 MED ORDER — METHOCARBAMOL 500 MG PO TABS
500.0000 mg | ORAL_TABLET | Freq: Once | ORAL | Status: AC
  Administered 2024-12-12: 500 mg via ORAL
  Filled 2024-12-12: qty 1

## 2024-12-12 MED ORDER — LIDOCAINE 5 % EX PTCH
1.0000 | MEDICATED_PATCH | Freq: Once | CUTANEOUS | Status: DC
  Administered 2024-12-12: 1 via TRANSDERMAL
  Filled 2024-12-12: qty 1

## 2024-12-12 MED ORDER — METHOCARBAMOL 500 MG PO TABS: 500.0000 mg | ORAL_TABLET | Freq: Two times a day (BID) | ORAL | 0 refills | Status: AC

## 2024-12-12 MED ORDER — HYDROCODONE-ACETAMINOPHEN 5-325 MG PO TABS
1.0000 | ORAL_TABLET | Freq: Once | ORAL | Status: AC
  Administered 2024-12-12: 1 via ORAL
  Filled 2024-12-12: qty 1

## 2024-12-12 MED ORDER — IBUPROFEN 400 MG PO TABS
600.0000 mg | ORAL_TABLET | Freq: Once | ORAL | Status: AC
  Administered 2024-12-12: 600 mg via ORAL
  Filled 2024-12-12: qty 1

## 2024-12-12 MED ORDER — IBUPROFEN 800 MG PO TABS: 800.0000 mg | ORAL_TABLET | Freq: Three times a day (TID) | ORAL | 0 refills | Status: AC

## 2024-12-12 MED ORDER — OXYCODONE HCL 5 MG PO TABS: 5.0000 mg | ORAL_TABLET | Freq: Once | ORAL | Status: DC
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
## 2024-12-12 NOTE — Discharge Instructions (Addendum)
 The pain you are experiencing is likely due to muscle strain, you may take Ibuprofen   and Robaxin  as needed for pain management. Do not combine with any pain reliever other than tylenol .  You may also use ice and heat, and over-the-counter remedies such as Biofreeze gel or salon pas lidocaine  patches (silver box). The muscle soreness should improve over the next week. Follow up with your family doctor in the next week for a recheck if you are still having symptoms. Return to ED if pain is worsening, you develop weakness or numbness of extremities, or new or concerning symptoms develop.
